# Patient Record
Sex: Male | Born: 1975 | Race: Black or African American | Hispanic: No | Smoking: Current every day smoker
Health system: Southern US, Community
[De-identification: ages and names within clinical notes are randomized; demographics above are authoritative.]

## PROBLEM LIST (undated history)

## (undated) DIAGNOSIS — K219 Gastro-esophageal reflux disease without esophagitis: Secondary | ICD-10-CM

## (undated) HISTORY — PX: NO PAST SURGERIES: SHX2092

---

## 2012-03-11 ENCOUNTER — Emergency Department: Payer: Self-pay | Admitting: *Deleted

## 2012-03-14 LAB — WOUND AEROBIC CULTURE

## 2013-06-28 ENCOUNTER — Emergency Department: Payer: Self-pay | Admitting: Emergency Medicine

## 2013-06-28 LAB — RAPID INFLUENZA A&B ANTIGENS (ARMC ONLY)

## 2014-11-11 ENCOUNTER — Encounter: Payer: Self-pay | Admitting: Emergency Medicine

## 2014-11-11 ENCOUNTER — Emergency Department
Admission: EM | Admit: 2014-11-11 | Discharge: 2014-11-11 | Disposition: A | Discharge status: Home / Self Care | Payer: Self-pay | Attending: Emergency Medicine | Admitting: Emergency Medicine

## 2014-11-11 DIAGNOSIS — L309 Dermatitis, unspecified: Secondary | ICD-10-CM | POA: Insufficient documentation

## 2014-11-11 DIAGNOSIS — Z720 Tobacco use: Secondary | ICD-10-CM | POA: Insufficient documentation

## 2014-11-11 MED ORDER — CLOTRIMAZOLE-BETAMETHASONE 1-0.05 % EX CREA: TOPICAL_CREAM | CUTANEOUS | Status: DC

## 2014-11-11 MED ORDER — HYDROXYZINE HCL 50 MG PO TABS: 50.0000 mg | ORAL_TABLET | Freq: Three times a day (TID) | ORAL | Status: DC | PRN

## 2014-11-11 NOTE — ED Notes (Signed)
Pt alert and oriented X4, active, cooperative, pt in NAD. RR even and unlabored, color WNL.  Pt informed to return if any life threatening symptoms occur.   

## 2014-11-11 NOTE — ED Notes (Signed)
Pt reports that he has had the rash since 2013, he thinks that it may be MRSA. No redness or drainage. It is a dark area with several white spots on it. It is on both ankles. Denies pain, has been putting hydrocortisone cream on it and it has been helping.

## 2014-11-11 NOTE — Discharge Instructions (Signed)
Take picture of rash with cell phone and follow up with [facility]ology [facility].

## 2014-11-11 NOTE — ED Provider Notes (Signed)
[facility] Emergency Department Provider Note  ____________________________________________  Time seen: Approximately 2:14 PM  I have reviewed the triage vital signs and the nursing notes.   HISTORY  Chief Complaint Rash    HPI Juan Holder is a 39 y.o. male patient complained of rash to the bilateral medial aspect of his bilateral lower legs for 3 years. Patient stated onset was while he was in prison. He is a rash comes and goes most times it resolves hydrocortisone over-the-counter cream. States there is itching associated with this. He is concerned he might have MRSA. Patient denies any fever or chills from his rash. He denies any drainage from the rash. Patient states the rash is not sees no and does not know any provocative incident producing a rash.   History reviewed. No pertinent past medical history.  There are no active problems to display for this patient.   History reviewed. No pertinent past surgical history.  Current Outpatient Rx  Name  Route  Sig  Dispense  Refill  . clotrimazole-betamethasone (LOTRISONE) cream      Apply to affected area 2 times daily   15 g   1   . hydrOXYzine (ATARAX/VISTARIL) 50 MG tablet   Oral   Take 1 tablet (50 mg total) by mouth 3 (three) times daily as needed.   30 tablet   0     Allergies Review of patient's allergies indicates no known allergies.  No family history on file.  Social History History  Substance Use Topics  . Smoking status: Current Every Day Smoker  . Smokeless tobacco: Not on file  . Alcohol Use: No    Review of Systems Constitutional: No fever/chills Eyes: No visual changes. ENT: No sore throat. Cardiovascular: Denies chest pain. Respiratory: Denies shortness of breath. Gastrointestinal: No abdominal pain.  No nausea, no vomiting.  No diarrhea.  No constipation. Genitourinary: Negative for dysuria. Musculoskeletal: Negative for back pain. Skin: Rash bilateral  legs. Neurological: Negative for headaches, focal weakness or numbness.  10-point ROS otherwise negative.  ____________________________________________   PHYSICAL EXAM:  VITAL SIGNS: ED Triage Vitals  Enc Vitals Group     BP 11/11/14 1229 136/71 mmHg     Pulse Rate 11/11/14 1229 69     Resp 11/11/14 1229 18     Temp 11/11/14 1229 98.3 F (36.8 C)     Temp Source 11/11/14 1229 Oral     SpO2 11/11/14 1229 100 %     Weight 11/11/14 1229 189 lb (85.73 kg)     Height 11/11/14 1229 6\' 2"  (1.88 m)     Head Cir --      Peak Flow --      Pain Score 11/11/14 1238 0     Pain Loc --      Pain Edu? --      Excl. in GC? --     Constitutional: Alert and oriented. Well appearing and in no acute distress. Eyes: Conjunctivae are normal. PERRL. EOMI. Head: Atraumatic. Nose: No congestion/rhinnorhea. Mouth/Throat: Mucous membranes are moist.  Oropharynx non-erythematous. Neck: No stridor.   Hematological/Lymphatic/Immunilogical: No cervical lymphadenopathy. Cardiovascular: Normal rate, regular rhythm. Grossly normal heart sounds.  Good peripheral circulation. Respiratory: Normal respiratory effort.  No retractions. Lungs CTAB. Gastrointestinal: Soft and nontender. No distention. No abdominal bruits. No CVA tenderness. Musculoskeletal: No lower extremity tenderness nor edema.  No joint effusions. Neurologic:  Normal speech and language. No gross focal neurologic deficits are appreciated. Speech is normal. No gait instability.  Skin:  Skin is warm, dry and intact. There is talking macular areas bilaterally measuring approximately 3 cm in circumference. There are small papular lesion visible. Area is nonerythematous and no signs of symptoms secondary infection Psychiatric: Mood and affect are normal. Speech and behavior are normal.  ____________________________________________   LABS (all labs ordered are listed, but only abnormal results are displayed)  Labs Reviewed - No data to  display ____________________________________________  EKG   ____________________________________________  RADIOLOGY   ____________________________________________   PROCEDURES  Procedure(s) performed: None  Critical Care performed: No  ____________________________________________   INITIAL IMPRESSION / ASSESSMENT AND PLAN / ED COURSE  Pertinent labs & imaging results that were available during my care of the patient were reviewed by me and considered in my medical decision making (see chart for details).  Dermatitis  ____________________________________________   FINAL CLINICAL IMPRESSION(S) / ED DIAGNOSES  Final diagnoses:  Dermatitis due to unknown cause      Joni Reiningonald K Smith, PA-C 11/11/14 1423

## 2014-11-11 NOTE — ED Notes (Signed)
Pt to ed with reports of rash to left ankle. Wants to be checked for MRSA>

## 2014-11-11 NOTE — ED Notes (Signed)
Pt has had "rash" to bilateral lower legs X3 years. Pt states that he is concerned it is MRSA. Denies pain, itching only. Darkened areas to inner bilateral lower legs, no discharge.

## 2014-12-24 ENCOUNTER — Emergency Department
Admission: EM | Admit: 2014-12-24 | Discharge: 2014-12-24 | Disposition: A | Discharge status: Home / Self Care | Payer: Self-pay | Attending: Emergency Medicine | Admitting: Emergency Medicine

## 2014-12-24 ENCOUNTER — Encounter: Payer: Self-pay | Admitting: Emergency Medicine

## 2014-12-24 DIAGNOSIS — R21 Rash and other nonspecific skin eruption: Secondary | ICD-10-CM | POA: Insufficient documentation

## 2014-12-24 DIAGNOSIS — Z720 Tobacco use: Secondary | ICD-10-CM | POA: Insufficient documentation

## 2014-12-24 MED ORDER — CLOTRIMAZOLE-BETAMETHASONE 1-0.05 % EX CREA: TOPICAL_CREAM | CUTANEOUS | Status: AC

## 2014-12-24 MED ORDER — SULFAMETHOXAZOLE-TRIMETHOPRIM 800-160 MG PO TABS: 1.0000 | ORAL_TABLET | Freq: Two times a day (BID) | ORAL | Status: DC

## 2014-12-24 NOTE — ED Notes (Signed)
Pt informed to return if any life threatening symptoms occur.  

## 2014-12-24 NOTE — Discharge Instructions (Signed)
YOU WILL NEED TO FOLLOW UP WITH A SKIN DOCTOR IF ANY CONTINUED PROBLEMS

## 2014-12-24 NOTE — ED Provider Notes (Signed)
[facility] Emergency Department Provider Note  ____________________________________________  Time seen:  11:13 AM  I have reviewed the triage vital signs and the nursing notes.   HISTORY  Chief Complaint Rash    HPI Juan Holder is a 39 y.o. male is here with complaint of rash on both his legs for approximately 8 months. He now has same rash in the groin area which is itching tremendously. He was here last month for the same at which time he was given a prescription for Lotrisone cream which seems to be helping and Atarax for itching. He denies any pain. He states the rash began shortly after his incarceration and has continued since.   History reviewed. No pertinent past medical history.  There are no active problems to display for this patient.   History reviewed. No pertinent past surgical history.  Current Outpatient Rx  Name  Route  Sig  Dispense  Refill  . clotrimazole-betamethasone (LOTRISONE) cream      Apply to affected area 2 times daily   30 g   1   . hydrOXYzine (ATARAX/VISTARIL) 50 MG tablet   Oral   Take 1 tablet (50 mg total) by mouth 3 (three) times daily as needed.   30 tablet   0   . sulfamethoxazole-trimethoprim (BACTRIM DS,SEPTRA DS) 800-160 MG per tablet   Oral   Take 1 tablet by mouth 2 (two) times daily.   20 tablet   0     Allergies Review of patient's allergies indicates no known allergies.  No family history on file.  Social History History  Substance Use Topics  . Smoking status: Current Every Day Smoker  . Smokeless tobacco: Not on file  . Alcohol Use: No    Review of Systems Constitutional: No fever/chills Eyes: No visual changes. ENT: No sore throat. Cardiovascular: Denies chest pain. Respiratory: Denies shortness of breath. Gastrointestinal: No abdominal pain.  No nausea, no vomiting.  Genitourinary: Negative for dysuria. Musculoskeletal: Negative for back pain. Skin: Positive for  rash. Neurological: Negative for headaches 10-point ROS otherwise negative.  ____________________________________________   PHYSICAL EXAM:  VITAL SIGNS: ED Triage Vitals  Enc Vitals Group     BP --      Pulse --      Resp --      Temp --      Temp src --      SpO2 --      Weight --      Height --      Head Cir --      Peak Flow --      Pain Score --      Pain Loc --      Pain Edu? --      Excl. in GC? --     Constitutional: Alert and oriented. Well appearing and in no acute distress. Eyes: Conjunctivae are normal. PERRL. EOMI. Head: Atraumatic. Nose: No congestion/rhinnorhea. Neck: No stridor.   Cardiovascular: Normal rate, regular rhythm. Grossly normal heart sounds.  Good peripheral circulation. Respiratory: Normal respiratory effort.  No retractions. Lungs CTAB. Gastrointestinal: Soft and nontender. No distention. No abdominal bruits. No CVA tenderness. Musculoskeletal: No lower extremity tenderness nor edema.  No joint effusions. Neurologic:  Normal speech and language. No gross focal neurologic deficits are appreciated. Speech is normal. No gait instability. Skin:  Skin is warm, dry and intact. There is superficial papular rash with open areas on the lower extremities bilaterally.. There is no erythema or drainage.  There are several small papular lesions in the pubic area without erythema. The majority of these are at the base of the hair follicle. Psychiatric: Mood and affect are normal. Speech and behavior are normal.  ____________________________________________   LABS (all labs ordered are listed, but only abnormal results are displayed)  Labs Reviewed - No data to display _ PROCEDURES  Procedure(s) performed: None  Critical Care performed: No  ____________________________________________   INITIAL IMPRESSION / ASSESSMENT AND PLAN / ED COURSE  Pertinent labs & imaging results that were available during my care of the patient were reviewed by me and  considered in my medical decision making (see chart for details).  Patient was given the name of a dermatologist to follow-up with if any continued problems. He is given a prescription for Lotrisone cream and Septra DS. ____________________________________________   FINAL CLINICAL IMPRESSION(S) / ED DIAGNOSES  Final diagnoses:  Rash and nonspecific skin eruption      Tommi Rumps, PA-C 12/24/14 1604  Jene Every, MD 12/26/14 1530

## 2014-12-24 NOTE — ED Notes (Signed)
Developed a rash to both lower legs about 8 mos ago.. Now has a rash to groin area  Positive itching

## 2015-12-06 ENCOUNTER — Emergency Department
Admission: EM | Admit: 2015-12-06 | Discharge: 2015-12-06 | Disposition: A | Discharge status: Home / Self Care | Payer: Self-pay | Attending: Emergency Medicine | Admitting: Emergency Medicine

## 2015-12-06 DIAGNOSIS — F17210 Nicotine dependence, cigarettes, uncomplicated: Secondary | ICD-10-CM | POA: Insufficient documentation

## 2015-12-06 DIAGNOSIS — Z79899 Other long term (current) drug therapy: Secondary | ICD-10-CM | POA: Insufficient documentation

## 2015-12-06 DIAGNOSIS — R197 Diarrhea, unspecified: Secondary | ICD-10-CM | POA: Insufficient documentation

## 2015-12-06 LAB — CBC
HEMATOCRIT: 39.8 % — AB (ref 40.0–52.0)
Hemoglobin: 13.5 g/dL (ref 13.0–18.0)
MCH: 30.5 pg (ref 26.0–34.0)
MCHC: 33.8 g/dL (ref 32.0–36.0)
MCV: 90.2 fL (ref 80.0–100.0)
PLATELETS: 191 10*3/uL (ref 150–440)
RBC: 4.42 MIL/uL (ref 4.40–5.90)
RDW: 13.3 % (ref 11.5–14.5)
WBC: 6.9 10*3/uL (ref 3.8–10.6)

## 2015-12-06 LAB — COMPREHENSIVE METABOLIC PANEL
ALT: 14 U/L — ABNORMAL LOW (ref 17–63)
AST: 24 U/L (ref 15–41)
Albumin: 4.2 g/dL (ref 3.5–5.0)
Alkaline Phosphatase: 68 U/L (ref 38–126)
Anion gap: 7 (ref 5–15)
BUN: 13 mg/dL (ref 6–20)
CHLORIDE: 107 mmol/L (ref 101–111)
CO2: 24 mmol/L (ref 22–32)
Calcium: 9.6 mg/dL (ref 8.9–10.3)
Creatinine, Ser: 1.24 mg/dL (ref 0.61–1.24)
Glucose, Bld: 101 mg/dL — ABNORMAL HIGH (ref 65–99)
POTASSIUM: 3.4 mmol/L — AB (ref 3.5–5.1)
Sodium: 138 mmol/L (ref 135–145)
Total Bilirubin: 0.5 mg/dL (ref 0.3–1.2)
Total Protein: 7.3 g/dL (ref 6.5–8.1)

## 2015-12-06 LAB — LIPASE, BLOOD: LIPASE: 18 U/L (ref 11–51)

## 2015-12-06 MED ORDER — LOPERAMIDE HCL 2 MG PO TABS: 2.0000 mg | ORAL_TABLET | Freq: Four times a day (QID) | ORAL | Status: DC | PRN

## 2015-12-06 NOTE — Discharge Instructions (Signed)

## 2015-12-06 NOTE — ED Provider Notes (Signed)
[facility] Emergency Department Provider Note  Time seen: 5:33 PM  I have reviewed the triage vital signs and the nursing notes.   HISTORY  Chief Complaint Diarrhea    HPI Juan Holder is a 40 y.o. male with no past medical history who presents to the emergency department with diarrhea. According to the patient he developed diarrhea yesterday, states he has had 3 episodes today. Denies any abdominal pain, nausea, vomiting, black or bloody stool. Denies any fever. States he did not think he could go to work due to the diarrhea.     No past medical history on file.  There are no active problems to display for this patient.   History reviewed. No pertinent past surgical history.  Current Outpatient Rx  Name  Route  Sig  Dispense  Refill  . omeprazole (PRILOSEC) 20 MG capsule   Oral   Take 20 mg by mouth daily.         . clotrimazole-betamethasone (LOTRISONE) cream      Apply to affected area 2 times daily   30 g   1   . hydrOXYzine (ATARAX/VISTARIL) 50 MG tablet   Oral   Take 1 tablet (50 mg total) by mouth 3 (three) times daily as needed.   30 tablet   0   . sulfamethoxazole-trimethoprim (BACTRIM DS,SEPTRA DS) 800-160 MG per tablet   Oral   Take 1 tablet by mouth 2 (two) times daily.   20 tablet   0     Allergies Review of patient's allergies indicates no known allergies.  No family history on file.  Social History Social History  Substance Use Topics  . Smoking status: Current Every Day Smoker    Types: Cigarettes  . Smokeless tobacco: None  . Alcohol Use: No    Review of Systems Constitutional: Negative for fever. Cardiovascular: Negative for chest pain. Respiratory: Negative for shortness of breath. Gastrointestinal: Negative for abdominal pain. Negative for nausea or vomiting. Positive for diarrhea. Genitourinary: Negative for dysuria Neurological: Negative for headache 10-point ROS otherwise  negative.  ____________________________________________   PHYSICAL EXAM:  VITAL SIGNS: ED Triage Vitals  Enc Vitals Group     BP 12/06/15 1547 112/74 mmHg     Pulse Rate 12/06/15 1547 89     Resp 12/06/15 1547 17     Temp 12/06/15 1547 98.5 F (36.9 C)     Temp Source 12/06/15 1547 Oral     SpO2 12/06/15 1547 98 %     Weight 12/06/15 1547 190 lb (86.183 kg)     Height 12/06/15 1547 6\' 2"  (1.88 m)     Head Cir --      Peak Flow --      Pain Score --      Pain Loc --      Pain Edu? --      Excl. in GC? --     Constitutional: Alert and oriented. Well appearing and in no distress. Eyes: Normal exam ENT   Head: Normocephalic and atraumatic.   Mouth/Throat: Mucous membranes are moist. Cardiovascular: Normal rate, regular rhythm. No murmur Respiratory: Normal respiratory effort without tachypnea nor retractions. Breath sounds are clear  Gastrointestinal: Soft and nontender. No distention.   Musculoskeletal: Nontender with normal range of motion in all extremities.  Neurologic:  Normal speech and language. No gross focal neurologic deficits Skin:  Skin is warm, dry and intact.  Psychiatric: Mood and affect are normal.  ____________________________________________   INITIAL IMPRESSION /  ASSESSMENT AND PLAN / ED COURSE  Pertinent labs & imaging results that were available during my care of the patient were reviewed by me and considered in my medical decision making (see chart for details).  Patient appears well, no distress. No abdominal tenderness. Labs are within normal limits. We'll discharge with loperamide and PCP follow-up. I discussed return precautions with the patient.  ____________________________________________   FINAL CLINICAL IMPRESSION(S) / ED DIAGNOSES  Diarrhea   Minna Antis, MD 12/06/15 1735

## 2015-12-06 NOTE — ED Notes (Signed)
Pt c/o diarrhea since yesterday.. Denies abd pain or vomiting..Marland Kitchen

## 2016-03-27 ENCOUNTER — Emergency Department: Payer: Self-pay

## 2016-03-27 ENCOUNTER — Encounter: Payer: Self-pay | Admitting: Emergency Medicine

## 2016-03-27 ENCOUNTER — Emergency Department
Admission: EM | Admit: 2016-03-27 | Discharge: 2016-03-27 | Disposition: A | Discharge status: Home / Self Care | Payer: Self-pay | Attending: Emergency Medicine | Admitting: Emergency Medicine

## 2016-03-27 DIAGNOSIS — F17210 Nicotine dependence, cigarettes, uncomplicated: Secondary | ICD-10-CM | POA: Insufficient documentation

## 2016-03-27 DIAGNOSIS — K4090 Unilateral inguinal hernia, without obstruction or gangrene, not specified as recurrent: Secondary | ICD-10-CM | POA: Insufficient documentation

## 2016-03-27 DIAGNOSIS — R1031 Right lower quadrant pain: Secondary | ICD-10-CM

## 2016-03-27 DIAGNOSIS — R52 Pain, unspecified: Secondary | ICD-10-CM

## 2016-03-27 DIAGNOSIS — N50811 Right testicular pain: Secondary | ICD-10-CM

## 2016-03-27 LAB — URINALYSIS COMPLETE WITH MICROSCOPIC (ARMC ONLY)
Bacteria, UA: NONE SEEN
Bilirubin Urine: NEGATIVE
GLUCOSE, UA: NEGATIVE mg/dL
Hgb urine dipstick: NEGATIVE
KETONES UR: NEGATIVE mg/dL
Leukocytes, UA: NEGATIVE
Nitrite: NEGATIVE
PROTEIN: 30 mg/dL — AB
Specific Gravity, Urine: 1.023 (ref 1.005–1.030)
pH: 7 (ref 5.0–8.0)

## 2016-03-27 NOTE — ED Notes (Signed)
Patient transported to Ultrasound 

## 2016-03-27 NOTE — ED Notes (Signed)
Pt complains of pain to right testicle intermittently for the past several years. Pt reports most recent episode happened a couple of says ago. Pt denies fevers, dysuria or other sx's other than intermittent pain. Pt wife reports pt has had a small lump come up before but it went back down. Pt reports lump was painful when it came up.

## 2016-03-27 NOTE — ED Notes (Signed)
Pt to US at this time.

## 2016-03-27 NOTE — ED Provider Notes (Signed)
[facility] Emergency Department Provider Note   First MD Initiated Contact with Patient 03/27/16 1022     (approximate)  I have reviewed the triage vital signs and the nursing notes.   HISTORY  Chief Complaint Testicle Pain   HPI Juan Holder is a 40 y.o. male presents with intermittent right testicular/groin pain since 2012. Patient states most recent pain episode 2 days ago and was described as intense at that time. Patient denies any pain at present current pain score "0" out of 10. Patient denies any nausea no vomiting or diarrhea. Patient denies any constipation or fever. Patient denies any urinary symptoms no dysuria urinary frequency or urgency.    There are no active problems to display for this patient.   Past surgical history None  Prior to Admission medications   Medication Sig Start Date End Date Taking? Authorizing Provider  hydrOXYzine (ATARAX/VISTARIL) 50 MG tablet Take 1 tablet (50 mg total) by mouth 3 (three) times daily as needed. 11/11/14   Joni Reining, PA-C  loperamide (IMODIUM A-D) 2 MG tablet Take 1 tablet (2 mg total) by mouth 4 (four) times daily as needed for diarrhea or loose stools. 12/06/15   Minna Antis, MD  omeprazole (PRILOSEC) 20 MG capsule Take 20 mg by mouth daily.    Historical Provider, MD  sulfamethoxazole-trimethoprim (BACTRIM DS,SEPTRA DS) 800-160 MG per tablet Take 1 tablet by mouth 2 (two) times daily. 12/24/14   Tommi Rumps, PA-C    Allergies Review of patient's allergies indicates no known allergies.  No family history on file.  Social History Social History  Substance Use Topics  . Smoking status: Current Every Day Smoker    Types: Cigarettes  . Smokeless tobacco: Never Used  . Alcohol use Yes     Comment: social    Review of Systems Constitutional: No fever/chills Eyes: No visual changes. ENT: No sore throat. Cardiovascular: Denies chest pain. Respiratory: Denies shortness of  breath. Gastrointestinal: No abdominal pain.  No nausea, no vomiting.  No diarrhea.  No constipation. Genitourinary: Negative for dysuria. Positive for right testicular pain Musculoskeletal: Negative for back pain. Skin: Negative for rash. Neurological: Negative for headaches, focal weakness or numbness.  10-point ROS otherwise negative.  ____________________________________________   PHYSICAL EXAM:  VITAL SIGNS: ED Triage Vitals  Enc Vitals Group     BP 03/27/16 0826 135/77     Pulse Rate 03/27/16 0826 74     Resp 03/27/16 0826 18     Temp 03/27/16 0826 98.7 F (37.1 C)     Temp Source 03/27/16 0826 Oral     SpO2 03/27/16 0826 98 %     Weight 03/27/16 0827 214 lb (97.1 kg)     Height 03/27/16 0827 6\' 2"  (1.88 m)     Head Circumference --      Peak Flow --      Pain Score 03/27/16 0831 7     Pain Loc --      Pain Edu? --      Excl. in GC? --     Constitutional: Alert and oriented. Well appearing and in no acute distress. Eyes: Conjunctivae are normal. PERRL. EOMI. Head: Atraumatic.Marland Kitchen Mouth/Throat: Mucous membranes are moist.  Oropharynx non-erythematous. Neck: No stridor.  No meningeal signs.  Cardiovascular: Normal rate, regular rhythm. Good peripheral circulation. Grossly normal heart sounds. Respiratory: Normal respiratory effort.  No retractions. Lungs CTAB. Gastrointestinal: Soft and nontender. No distention.  Musculoskeletal: No lower extremity tenderness nor edema.  No gross deformities of extremities. Neurologic:  Normal speech and language. No gross focal neurologic deficits are appreciated.  Skin:  Skin is warm, dry and intact. No rash noted. Psychiatric: Mood and affect are normal. Speech and behavior are normal.  ____________________________________________   LABS (all labs ordered are listed, but only abnormal results are displayed)  Labs Reviewed  URINALYSIS COMPLETEWITH MICROSCOPIC ([facility] ONLY)    RADIOLOGY I, Carleton N Aarohi Redditt, personally viewed  and evaluated these images (plain radiographs) as part of my medical decision making, as well as reviewing the written report by the radiologist.  [place] Extrem Low Right Ltd  Result Date: 03/27/2016 CLINICAL DATA:  Right groin pain for 5 years. EXAM: ULTRASOUND RIGHT LOWER EXTREMITY LIMITED TECHNIQUE: Ultrasound examination of the lower extremity soft tissues was performed in the area of clinical concern. COMPARISON:  None FINDINGS: Normal appearing lymph node at the area of interest in the right groin. This lymph node measures 1.4 x 0.6 x 0.7 cm. No suspicious solid or cystic lesions at the area of concern. IMPRESSION: Normal lymph node at the area of concern in the right groin. Electronically Signed   By: Richarda OverlieAdam  Henn M.D.   On: 03/27/2016 10:08      Procedures     INITIAL IMPRESSION / ASSESSMENT AND PLAN / ED COURSE  Pertinent labs & imaging results that were available during my care of the patient were reviewed by me and considered in my medical decision making (see chart for details).     Clinical Course    ____________________________________________  FINAL CLINICAL IMPRESSION(S) / ED DIAGNOSES  Final diagnoses:  Pain  Pain in right testicle  Right inguinal hernia     MEDICATIONS GIVEN DURING THIS VISIT:  Medications - No data to display   NEW OUTPATIENT MEDICATIONS STARTED DURING THIS VISIT:  New Prescriptions   No medications on file    Modified Medications   No medications on file    Discontinued Medications   No medications on file     Note:  This document was prepared using Dragon voice recognition software and may include unintentional dictation errors.    Darci Currentandolph N Janie Strothman, MD 03/28/16 [phone]

## 2016-03-27 NOTE — ED Triage Notes (Signed)
Pain in right testicle after voiding.  It started in 2012.  Recurrent.  This episode began 2 days ago.  Patient states he thinks he has a hernia.  Denies any dysuria.

## 2016-03-29 ENCOUNTER — Ambulatory Visit (INDEPENDENT_AMBULATORY_CARE_PROVIDER_SITE_OTHER): Payer: Self-pay | Admitting: Surgery

## 2016-03-29 ENCOUNTER — Encounter: Payer: Self-pay | Admitting: Surgery

## 2016-03-29 VITALS — BP 158/81 | HR 83 | Temp 99.2°F | Ht 74.0 in | Wt 213.2 lb

## 2016-03-29 DIAGNOSIS — R1030 Lower abdominal pain, unspecified: Secondary | ICD-10-CM

## 2016-03-29 NOTE — Progress Notes (Signed)
  Surgical Consultation  03/29/2016  Juan HalstedMelvin D Holder is an 40 y.o. male.   Chief Complaint  Patient presents with  . New Patient (Initial Visit)    Right Inguinal Hernia     HPI: This is a 40 year old male with a history of right inguinal pain that is started over 5 years ago. He reports and he has intermittent right inguinal pain radiated to the right testicle. Over the last few weeks it has increased in intensity and now is moderate to severe. It is sharp and is worsening when he voids. He recently went to the emergency room where an ultrasound was performed without any definitive evidence of any particular pathology. I have personally reviewed the ultrasound. There is no evidence of testicular torsion. He denies any other previous abdominal surgeries. He has good cardiovascular performance and is able to do more than 4 Mets without any shortness of breath or chest pain  History reviewed. No pertinent past medical history.  Past Surgical History:  Procedure Laterality Date  . NO PAST SURGERIES      Family History  Problem Relation Age of Onset  . Hypertension Father   . Lumbar disc disease Father     Social History:  reports that he has been smoking Cigarettes.  He has been smoking about 0.50 packs per day. He has never used smokeless tobacco. He reports that he drinks alcohol. He reports that he does not use drugs.  Allergies: No Known Allergies  Medications reviewed.     ROS Full ROS performed and is  otherwise negative other than what is  stated in the history of present illness  BP (!) 158/81   Pulse 83   Temp 99.2 F (37.3 C) (Oral)   Ht 6\' 2"  (1.88 m)   Wt 96.7 kg (213 lb 3.2 oz)   BMI 27.37 kg/m   Physical Exam  Constitutional: He is oriented to person, place, and time and well-developed, well-nourished, and in no distress. No distress.  Eyes: Conjunctivae are normal. No scleral icterus.  Neck: Neck supple. No JVD present. No tracheal deviation present.   Cardiovascular: Normal rate and intact distal pulses.   Pulmonary/Chest: Effort normal. No respiratory distress.  Abdominal: Soft. He exhibits no distension and no mass. There is no tenderness. There is no rebound and no guarding.  Reducible Umbilical hernia, no definitive Inguinal hernia palpated  Genitourinary: Penis normal. No discharge found.  Genitourinary Comments: Testicles w/o lesions or masses. Non tender to palpation   Musculoskeletal: Normal range of motion. He exhibits no edema.  Neurological: He is alert and oriented to person, place, and time. GCS score is 15.  Skin: Skin is warm and dry.  Psychiatric: Mood, memory, affect and judgment normal.  Nursing note and vitals reviewed.    No results found for this or any previous visit (from the past 48 hour(s)). No results found.  Assessment/Plan: 1. Lower abdominal pain Right inguinal pain w/o definitive evidence of hernia on PE . Differential will  definitely include at sports hernia as well as GU pathology We will obtain CT A/P as well as GU consultation. No need for surgical intervention at this time. Counseling provided. - CT Abdomen Pelvis W Contrast; Future   Sterling Bigiego Amantha Sklar, MD FACS General Surgeon

## 2016-03-29 NOTE — Patient Instructions (Addendum)
We have scheduled you for a CT Scan of your Abdomen and Pelvis. This has been scheduled for [employer] on 04/04/16 at 1100am. Arrival at [place], [place], [place],  [address]  You will need to pick up a prep kit at least 24 hours in advance of your Scan: You may pick this up at the [facility] [place] [facility], or [employer].  Bring a list of medications with you to your appointment.  If you have an allergy to IVP dye or CT contrast and this has not been addressed, please call our office [phone] and ask to speak with a nurse at least 48 hours in advance of your CT Scan.  Nothing to eat or drink 4 hours prior to your CT Scan.   If you need to reschedule your Scan, you may do so by calling [phone].  We will have you follow-up in 2 weeks as scheduled below.  We will also place a referral to [facility] for your testicular pain. We will call you as soon as the appointment has been made.

## 2016-04-04 ENCOUNTER — Ambulatory Visit
Admission: RE | Admit: 2016-04-04 | Discharge: 2016-04-04 | Disposition: A | Discharge status: Home / Self Care | Payer: Self-pay | Source: Ambulatory Visit | Attending: Surgery | Admitting: Surgery

## 2016-04-04 DIAGNOSIS — K429 Umbilical hernia without obstruction or gangrene: Secondary | ICD-10-CM | POA: Insufficient documentation

## 2016-04-04 DIAGNOSIS — R1030 Lower abdominal pain, unspecified: Secondary | ICD-10-CM | POA: Insufficient documentation

## 2016-04-04 MED ORDER — IOPAMIDOL (ISOVUE-300) INJECTION 61%: 100.0000 mL | Freq: Once | INTRAVENOUS | Status: DC | PRN

## 2016-04-04 MED ORDER — IOPAMIDOL (ISOVUE-370) INJECTION 76%
100.0000 mL | Freq: Once | INTRAVENOUS | Status: AC | PRN
  Administered 2016-04-04: 100 mL via INTRAVENOUS

## 2016-04-06 ENCOUNTER — Telehealth: Payer: Self-pay

## 2016-04-06 NOTE — Telephone Encounter (Signed)
A referral has been entered in EPIC to refer patient to [facility] for testicular pain.   The department will call him to make an appointment.  I will follow up on referral to make sure appointment is made. I will document once appointment is made.

## 2016-04-06 NOTE — Telephone Encounter (Signed)
Please place Urology referral to [facility] for testicular pain for this patient.

## 2016-04-07 ENCOUNTER — Telehealth: Payer: Self-pay

## 2016-04-07 NOTE — Telephone Encounter (Addendum)
CT was received and reviewed with Dr. Everlene FarrierPabon. Patient is in need of GI appointment with Dr. Servando SnareWohl for possible Crohn's disease seen on CT Scan.  Patient scheduled with Dr. Servando SnareWohl on 04/11/16 at 1430 in [facility].  Call made to patient at this time to go over results of CT scan and give patient appointment information. No answer. Unable to leave a voicemail as this has not been set up yet.

## 2016-04-07 NOTE — Telephone Encounter (Signed)
I have contacted [facility] to get an update on status of referral. Appointment has not been made at this time. The referral cord is out until next week. The referral will be worked up at that time.

## 2016-04-10 NOTE — Telephone Encounter (Signed)
Appointment has been made with Dr Charyl DancerBrandon--Urology on 05/03/16 @ 11:00am.

## 2016-04-11 ENCOUNTER — Ambulatory Visit (INDEPENDENT_AMBULATORY_CARE_PROVIDER_SITE_OTHER): Payer: Self-pay | Admitting: Gastroenterology

## 2016-04-11 ENCOUNTER — Encounter: Payer: Self-pay | Admitting: Gastroenterology

## 2016-04-11 ENCOUNTER — Other Ambulatory Visit: Payer: Self-pay

## 2016-04-11 VITALS — BP 147/67 | HR 66 | Temp 98.9°F | Ht 74.0 in | Wt 215.6 lb

## 2016-04-11 DIAGNOSIS — R109 Unspecified abdominal pain: Secondary | ICD-10-CM

## 2016-04-11 DIAGNOSIS — R933 Abnormal findings on diagnostic imaging of other parts of digestive tract: Secondary | ICD-10-CM

## 2016-04-11 MED ORDER — PEG 3350-KCL-NABCB-NACL-NASULF 236 G PO SOLR
ORAL | 0 refills | Status: AC
Start: 2016-04-11 — End: ?

## 2016-04-11 NOTE — Progress Notes (Signed)
Gastroenterology Consultation  Referring Provider:     No ref. provider found Primary Care Physician:  No PCP Per Patient Primary Gastroenterologist:  Dr. Servando Snare     Reason for Consultation:     Abdominal bloating and abnormal CT scan        HPI:   Juan Holder is a 40 y.o. y/o male referred for consultation & management of Abdominal bloating and abnormal CT scan by Dr. Bonnetta Barry PCP Per Patient.  This patient comes today with a history of peptic ulcer disease and reflux who is presently on a PPI. The patient was seen by surgery for abdominal bloating and right-sided abdominal pain that went down to his groin and right leg. The patient denies any black stools or bloody stools although he states he has seen some bright red blood on the toilet paper in the past. He also denies any diarrhea. The patient was sent for CT scan that showed him to have thickening of the terminal ileum with possible differential diagnosis including infection inflammation and Crohn's disease. The patient denies any family history of inflammatory bowel disease. He also denies any joint pains except one joint in his right leg. There is no report of any unexplained weight loss. I'm now being asked to see the patient for his abnormal CT scan.  History reviewed. No pertinent past medical history.  Past Surgical History:  Procedure Laterality Date  . NO PAST SURGERIES      Prior to Admission medications   Medication Sig Start Date End Date Taking? Authorizing Provider  hydrOXYzine (ATARAX/VISTARIL) 50 MG tablet Take 1 tablet (50 mg total) by mouth 3 (three) times daily as needed. 11/11/14  Yes Joni Reining, PA-C  omeprazole (PRILOSEC) 20 MG capsule Take 20 mg by mouth daily.   Yes Historical Provider, MD  triamcinolone (KENALOG) 0.025 % cream Apply 1 application topically daily.   Yes Historical Provider, MD  polyethylene glycol (GOLYTELY) 236 g solution Drink one 8 oz glass every 20 mins until stools are clear. 04/11/16    Midge Minium, MD    Family History  Problem Relation Age of Onset  . Hypertension Father   . Lumbar disc disease Father      Social History  Substance Use Topics  . Smoking status: Current Every Day Smoker    Packs/day: 0.50    Types: Cigarettes  . Smokeless tobacco: Never Used  . Alcohol use Yes     Comment: 1 Beer Weekly    Allergies as of 04/11/2016  . (No Known Allergies)    Review of Systems:    All systems reviewed and negative except where noted in HPI.   Physical Exam:  BP (!) 147/67   Pulse 66   Temp 98.9 F (37.2 C) (Oral)   Ht 6\' 2"  (1.88 m)   Wt 215 lb 9.6 oz (97.8 kg)   BMI 27.68 kg/m  No LMP for male patient. Psych:  Alert and cooperative. Normal mood and affect. General:   Alert,  Well-developed, well-nourished, pleasant and cooperative in NAD Head:  Normocephalic and atraumatic. Eyes:  Sclera clear, no icterus.   Conjunctiva pink. Ears:  Normal auditory acuity. Nose:  No deformity, discharge, or lesions. Mouth:  No deformity or lesions,oropharynx pink & moist. Neck:  Supple; no masses or thyromegaly. Lungs:  Respirations even and unlabored.  Clear throughout to auscultation.   No wheezes, crackles, or rhonchi. No acute distress. Heart:  Regular rate and rhythm; no murmurs, clicks, rubs, or  gallops. Abdomen:  Normal bowel sounds.  No bruits.  Soft, non-tender and non-distended without masses, hepatosplenomegaly or hernias noted.  No guarding or rebound tenderness.  Negative Carnett sign.   Rectal:  Deferred.  Msk:  Symmetrical without gross deformities.  Good, equal movement & strength bilaterally. Pulses:  Normal pulses noted. Extremities:  No clubbing or edema.  No cyanosis. Neurologic:  Alert and oriented x3;  grossly normal neurologically. Skin:  Intact without significant lesions or rashes.  No jaundice. Lymph Nodes:  No significant cervical adenopathy. Psych:  Alert and cooperative. Normal mood and affect.  Imaging Studies: Koreas  Scrotum  Result Date: 03/27/2016 CLINICAL DATA:  Right testicular and groin pain, intermittent but worsening over the last 2 days. EXAM: SCROTAL ULTRASOUND DOPPLER ULTRASOUND OF THE TESTICLES TECHNIQUE: Complete ultrasound examination of the testicles, epididymis, and other scrotal structures was performed. Color and spectral Doppler ultrasound were also utilized to evaluate blood flow to the testicles. COMPARISON:  None. FINDINGS: Right testicle Measurements: 4.4 by 2.5 by 2.9 cm. No mass or microlithiasis visualized. Faint heteroechogenicity in the testicle. Left testicle Measurements: 4.4 by 2.3 by 2.6 cm. No mass or microlithiasis visualized. Faint heteroechogenicity in the testicle Right epididymis:  Normal in size and appearance. Left epididymis:  Normal in size and appearance. Hydrocele:  None visualized. Varicocele:  None visualized. Pulsed Doppler interrogation of both testes demonstrates normal low resistance arterial and venous waveforms bilaterally. IMPRESSION: 1. The only notable finding is faint heteroechogenicity in both testicles, without mass identified. In middle-aged and older patients, such heterogeneous echotexture is typically ascribed to incidental seminiferous tubular atrophy. Normal Doppler assessment of both testicles. Electronically Signed   By: Gaylyn RongWalter  Liebkemann M.D.   On: 03/27/2016 12:05   Ct Abdomen Pelvis W Contrast  Result Date: 04/04/2016 CLINICAL DATA:  Abdominal pain. Recently evaluated in the emergency room for right groin pain. EXAM: CT ABDOMEN AND PELVIS WITH CONTRAST TECHNIQUE: Multidetector CT imaging of the abdomen and pelvis was performed using the standard protocol following bolus administration of intravenous contrast. CONTRAST:  100 mL Isovue 370 COMPARISON:  Scrotal ultrasound 03/27/2016 and right lower extremity ultrasound 03/27/2016 FINDINGS: Lower chest: The visualized lung bases are clear. The heart size is normal. Hepatobiliary: No focal liver abnormality  is seen. No gallstones, gallbladder wall thickening, or biliary dilatation. Pancreas: Unremarkable. No pancreatic ductal dilatation or surrounding inflammatory changes. Spleen: Normal in size without focal abnormality. Adrenals/Urinary Tract: Adrenal glands are unremarkable. Kidneys are normal, without renal calculi, focal lesion, or hydronephrosis. Bladder is unremarkable. Stomach/Bowel: Stomach is within normal limits. Appendix appears normal. There is wall thickening of the terminal ileum. A single layer wall thickness measures up to 8 mm on the coronal image number 34. Proximal to the terminal ileum, the remainder of the small bowel is normal in wall thickness. There is no evidence of bowel obstruction. There is no stranding or fluid in the mesentery surrounding the terminal ileum and there is no free pelvic fluid. There are reactive size lymph nodes in the ileocolic mesentery. For example, there is a right ileocolic lymph node measuring short 7 mm short axis and 13 mm in length. There are additional smaller lymph nodes in this region. The colon appears within normal limits. Vascular/Lymphatic: No significant vascular findings are present. Reactive size right lower quadrant ileocolic lymph nodes is described above. Reproductive: Prostate is unremarkable. Other: The inguinal regions are unremarkable. Negative for inguinal hernia. There is an umbilical hernia containing fat only. The hernia sac measures 2.1 x 2.4  x 1.8 cm and the mouth of the hernia is 1.5 cm in with. Musculoskeletal: No acute or significant osseous findings. Sacroiliac joints appear within normal limits. IMPRESSION: Wall thickening of the terminal ileum and reactive size lymph nodes in the ileocolic mesenteric. Findings raise the possibility of ileitis, which can be infectious, or secondary to inflammatory bowel disease, especially Crohn's disease. There is no inflammatory stranding or fluid surrounding the terminal ileum, and there is no bowel  dilatation. The inguinal regions are unremarkable. Negative for urinary tract obstruction. Fat-containing umbilical hernia. Electronically Signed   By: Britta Mccreedy M.D.   On: 04/04/2016 13:24   [place] Extrem Low Right Ltd  Result Date: 03/27/2016 CLINICAL DATA:  Right groin pain for 5 years. EXAM: ULTRASOUND RIGHT LOWER EXTREMITY LIMITED TECHNIQUE: Ultrasound examination of the lower extremity soft tissues was performed in the area of clinical concern. COMPARISON:  None FINDINGS: Normal appearing lymph node at the area of interest in the right groin. This lymph node measures 1.4 x 0.6 x 0.7 cm. No suspicious solid or cystic lesions at the area of concern. IMPRESSION: Normal lymph node at the area of concern in the right groin. Electronically Signed   By: Richarda Overlie M.D.   On: 03/27/2016 10:08   [place] Art/ven Flow Abd Pelv Doppler  Result Date: 03/27/2016 CLINICAL DATA:  Right testicular and groin pain, intermittent but worsening over the last 2 days. EXAM: SCROTAL ULTRASOUND DOPPLER ULTRASOUND OF THE TESTICLES TECHNIQUE: Complete ultrasound examination of the testicles, epididymis, and other scrotal structures was performed. Color and spectral Doppler ultrasound were also utilized to evaluate blood flow to the testicles. COMPARISON:  None. FINDINGS: Right testicle Measurements: 4.4 by 2.5 by 2.9 cm. No mass or microlithiasis visualized. Faint heteroechogenicity in the testicle. Left testicle Measurements: 4.4 by 2.3 by 2.6 cm. No mass or microlithiasis visualized. Faint heteroechogenicity in the testicle Right epididymis:  Normal in size and appearance. Left epididymis:  Normal in size and appearance. Hydrocele:  None visualized. Varicocele:  None visualized. Pulsed Doppler interrogation of both testes demonstrates normal low resistance arterial and venous waveforms bilaterally. IMPRESSION: 1. The only notable finding is faint heteroechogenicity in both testicles, without mass identified. In middle-aged and older  patients, such heterogeneous echotexture is typically ascribed to incidental seminiferous tubular atrophy. Normal Doppler assessment of both testicles. Electronically Signed   By: Gaylyn Rong M.D.   On: 03/27/2016 12:05    Assessment and Plan:   Juan Holder is a 40 y.o. y/o male who comes in today after having a CT scan which was suggestive of possible Crohn's disease. The patient does have some bloating and has had a small amount of rectal bleeding that sounds to be most consistent with hemorrhoidal bleeding. The patient will be set up for colonoscopy to rule out terminal ileitis/Crohn's disease as the cause of his bloating and abdominal pain. The patient has been explained the plan and agrees with it.   Note: This dictation was prepared with Dragon dictation along with smaller phrase technology. Any transcriptional errors that result from this process are unintentional.

## 2016-04-11 NOTE — Patient Instructions (Signed)
You are scheduled for a colonoscopy at [facility] on Monday, November 6th. You have been given instructions and a prescription for a bowel prep solution. If you have any questions in regards to this procedure, please contact our office.

## 2016-04-13 ENCOUNTER — Ambulatory Visit: Payer: Self-pay | Admitting: Surgery

## 2016-04-24 ENCOUNTER — Encounter: Payer: Self-pay | Admitting: *Deleted

## 2016-04-26 NOTE — Discharge Instructions (Signed)

## 2016-05-01 ENCOUNTER — Encounter
Admission: RE | Disposition: A | Discharge status: Home / Self Care | Payer: Self-pay | Source: Ambulatory Visit | Attending: Gastroenterology

## 2016-05-01 ENCOUNTER — Ambulatory Visit
Admission: RE | Admit: 2016-05-01 | Discharge: 2016-05-01 | Disposition: A | Discharge status: Home / Self Care | Payer: Self-pay | Source: Ambulatory Visit | Attending: Gastroenterology | Admitting: Gastroenterology

## 2016-05-01 ENCOUNTER — Ambulatory Visit: Payer: Self-pay | Admitting: Anesthesiology

## 2016-05-01 DIAGNOSIS — F17210 Nicotine dependence, cigarettes, uncomplicated: Secondary | ICD-10-CM | POA: Insufficient documentation

## 2016-05-01 DIAGNOSIS — Z79899 Other long term (current) drug therapy: Secondary | ICD-10-CM | POA: Insufficient documentation

## 2016-05-01 DIAGNOSIS — R198 Other specified symptoms and signs involving the digestive system and abdomen: Secondary | ICD-10-CM

## 2016-05-01 DIAGNOSIS — R933 Abnormal findings on diagnostic imaging of other parts of digestive tract: Secondary | ICD-10-CM | POA: Insufficient documentation

## 2016-05-01 DIAGNOSIS — K219 Gastro-esophageal reflux disease without esophagitis: Secondary | ICD-10-CM | POA: Insufficient documentation

## 2016-05-01 DIAGNOSIS — K641 Second degree hemorrhoids: Secondary | ICD-10-CM | POA: Insufficient documentation

## 2016-05-01 HISTORY — PX: COLONOSCOPY WITH PROPOFOL: SHX5780

## 2016-05-01 HISTORY — DX: Gastro-esophageal reflux disease without esophagitis: K21.9

## 2016-05-01 SURGERY — COLONOSCOPY WITH PROPOFOL
Anesthesia: Monitor Anesthesia Care | Wound class: Contaminated

## 2016-05-01 MED ORDER — SIMETHICONE 40 MG/0.6ML PO SUSP
ORAL | Status: DC | PRN
  Administered 2016-05-01: 08:00:00

## 2016-05-01 MED ORDER — LIDOCAINE HCL (CARDIAC) 20 MG/ML IV SOLN
INTRAVENOUS | Status: DC | PRN
  Administered 2016-05-01: 50 mg via INTRAVENOUS

## 2016-05-01 MED ORDER — OXYCODONE HCL 5 MG/5ML PO SOLN: 5.0000 mg | Freq: Once | ORAL | Status: DC | PRN

## 2016-05-01 MED ORDER — OXYCODONE HCL 5 MG PO TABS: 5.0000 mg | ORAL_TABLET | Freq: Once | ORAL | Status: DC | PRN

## 2016-05-01 MED ORDER — LACTATED RINGERS IV SOLN
INTRAVENOUS | Status: DC
  Administered 2016-05-01: 07:00:00 via INTRAVENOUS

## 2016-05-01 MED ORDER — PROPOFOL 10 MG/ML IV BOLUS
INTRAVENOUS | Status: DC | PRN
  Administered 2016-05-01: 50 mg via INTRAVENOUS
  Administered 2016-05-01: 30 mg via INTRAVENOUS
  Administered 2016-05-01 (×2): 50 mg via INTRAVENOUS
  Administered 2016-05-01: 100 mg via INTRAVENOUS

## 2016-05-01 SURGICAL SUPPLY — 23 items

## 2016-05-01 NOTE — Anesthesia Procedure Notes (Signed)
Procedure Name: MAC Performed by: Bertram Haddix Pre-anesthesia Checklist: Patient identified, Emergency Drugs available, Suction available, Timeout performed and Patient being monitored Patient Re-evaluated:Patient Re-evaluated prior to inductionOxygen Delivery Method: Nasal cannula Placement Confirmation: positive ETCO2     

## 2016-05-01 NOTE — Transfer of Care (Signed)
Immediate Anesthesia Transfer of Care Note  Patient: Juan Holder  Procedure(s) Performed: Procedure(s): COLONOSCOPY WITH PROPOFOL (N/A)  Patient Location: PACU  Anesthesia Type: MAC  Level of Consciousness: awake, alert  and patient cooperative  Airway and Oxygen Therapy: Patient Spontanous Breathing and Patient connected to supplemental oxygen  Post-op Assessment: Post-op Vital signs reviewed, Patient's Cardiovascular Status Stable, Respiratory Function Stable, Patent Airway and No signs of Nausea or vomiting  Post-op Vital Signs: Reviewed and stable  Complications: No apparent anesthesia complications

## 2016-05-01 NOTE — Anesthesia Preprocedure Evaluation (Signed)
Anesthesia Evaluation  Patient identified by MRN, date of birth, ID band  Reviewed: NPO status   History of Anesthesia Complications Negative for: history of anesthetic complications  Airway Mallampati: II  TM Distance: >3 FB Neck ROM: full    Dental no notable dental hx.    Pulmonary Current Smoker,    Pulmonary exam normal        Cardiovascular Exercise Tolerance: Good negative cardio ROS Normal cardiovascular exam     Neuro/Psych negative neurological ROS  negative psych ROS   GI/Hepatic Neg liver ROS, GERD  ,  Endo/Other  negative endocrine ROS  Renal/GU negative Renal ROS  negative genitourinary   Musculoskeletal   Abdominal   Peds  Hematology negative hematology ROS (+)   Anesthesia Other Findings   Reproductive/Obstetrics                             Anesthesia Physical Anesthesia Plan  ASA: II  Anesthesia Plan: MAC   Post-op Pain Management:    Induction:   Airway Management Planned:   Additional Equipment:   Intra-op Plan:   Post-operative Plan:   Informed Consent: I have reviewed the patients History and Physical, chart, labs and discussed the procedure including the risks, benefits and alternatives for the proposed anesthesia with the patient or authorized representative who has indicated his/her understanding and acceptance.     Plan Discussed with: CRNA  Anesthesia Plan Comments:         Anesthesia Quick Evaluation

## 2016-05-01 NOTE — Anesthesia Postprocedure Evaluation (Signed)
Anesthesia Post Note  Patient: Juan HalstedMelvin D Marker  Procedure(s) Performed: Procedure(s) (LRB): COLONOSCOPY WITH PROPOFOL (N/A)  Patient location during evaluation: PACU Anesthesia Type: MAC Level of consciousness: awake and alert Pain management: pain level controlled Vital Signs Assessment: post-procedure vital signs reviewed and stable Respiratory status: spontaneous breathing, nonlabored ventilation, respiratory function stable and patient connected to nasal cannula oxygen Cardiovascular status: stable and blood pressure returned to baseline Anesthetic complications: no    Sandara Tyree

## 2016-05-01 NOTE — Op Note (Signed)
[facility]enter[facility] Patient Name: Juan PebblesMelvin Holder Procedure Date: 05/01/2016 7:47 AM MRN: [id number] Account #: [id number] Date of Birth: 01/27/1976 Admit Type: Outpatient Age: [age] [facility]OM 01 Gender: Male Note Status: Finalized Procedure:            Colonoscopy Indications:          Abnormal CT of the GI tract Providers:            Midge Miniumarren Amareon Phung MD, MD Medicines:            Propofol per Anesthesia Complications:        No immediate complications. Procedure:            Pre-Anesthesia Assessment:                       - Prior to the procedure, a History and Physical was                        performed, and patient medications and allergies were                        reviewed. The patient's tolerance of previous                        anesthesia was also reviewed. The risks and benefits of                        the procedure and the sedation options and risks were                        discussed with the patient. All questions were                        answered, and informed consent was obtained. Prior                        Anticoagulants: The patient has taken no previous                        anticoagulant or antiplatelet agents. ASA Grade                        Assessment: II - A patient with mild systemic disease.                        After reviewing the risks and benefits, the patient was                        deemed in satisfactory condition to undergo the                        procedure.                       After obtaining informed consent, the colonoscope was                        passed under direct vision. Throughout the procedure,                        the patient's blood  pressure, pulse, and oxygen                        saturations were monitored continuously. The was                        introduced through the anus and advanced to the the                        terminal ileum. The colonoscopy was performed without                  difficulty. The patient tolerated the procedure well.                        The quality of the bowel preparation was poor. Findings:      The perianal and digital rectal examinations were normal.      The terminal ileum appeared normal. Biopsies were taken with a cold       forceps for histology.      A moderate amount of stool was found in the entire colon.      Non-bleeding internal hemorrhoids were found during retroflexion. The       hemorrhoids were Grade II (internal hemorrhoids that prolapse but reduce       spontaneously). Impression:           - Preparation of the colon was poor.                       - The examined portion of the ileum was normal.                        Biopsied.                       - Stool in the entire examined colon. Recommendation:       - Discharge patient to home.                       - Resume previous diet.                       - Continue present medications. Procedure Code(s):    --- Professional ---                       [id number], Colonoscopy, flexible; with biopsy, single or                        multiple Diagnosis Code(s):    --- Professional ---                       R93.3, Abnormal findings on diagnostic imaging of other                        parts of digestive tract CPT copyright 2016 American Medical Association. All rights reserved. The codes documented in this report are preliminary and upon coder review may  be revised to meet current compliance requirements. Midge Miniumarren Laird Runnion MD, MD 05/01/2016 8:09:44 AM This report has been signed electronically. Number of Addenda: 0 Note Initiated On: 05/01/2016 7:47 AM Scope Withdrawal Time: 0 hours 5 minutes 21 seconds  Total Procedure Duration: 0  hours 9 minutes 4 seconds       [facility]

## 2016-05-01 NOTE — H&P (Signed)
  Midge Miniumarren Jessalynn Mccowan, MD [facility] [address] [place], [place] [address] Phone: [phone] Fax : [phone]  Primary Care Physician:  No PCP Per Patient Primary Gastroenterologist:  Dr. Servando SnareWohl  Pre-Procedure History & Physical: HPI:  Juan Holder is a 40 y.o. male is here for an colonoscopy.   Past Medical History:  Diagnosis Date  . GERD (gastroesophageal reflux disease)     Past Surgical History:  Procedure Laterality Date  . NO PAST SURGERIES      Prior to Admission medications   Medication Sig Start Date End Date Taking? Authorizing Provider  Omega-3 Fatty Acids (FISH OIL PO) Take by mouth.   Yes Historical Provider, MD  omeprazole (PRILOSEC) 20 MG capsule Take 20 mg by mouth daily.   Yes Historical Provider, MD  polyethylene glycol (GOLYTELY) 236 g solution Drink one 8 oz glass every 20 mins until stools are clear. 04/11/16  Yes Midge Miniumarren Remee Charley, MD  triamcinolone (KENALOG) 0.025 % cream Apply 1 application topically daily.   Yes Historical Provider, MD    Allergies as of 04/11/2016  . (No Known Allergies)    Family History  Problem Relation Age of Onset  . Hypertension Father   . Lumbar disc disease Father     Social History   Social History  . Marital status: Significant Other    Spouse name: N/A  . Number of children: N/A  . Years of education: N/A   Occupational History  . Not on file.   Social History Main Topics  . Smoking status: Current Every Day Smoker    Packs/day: 0.50    Years: 26.00    Types: Cigarettes  . Smokeless tobacco: Never Used  . Alcohol use Yes     Comment: 1 Beer Weekly  . Drug use: No  . Sexual activity: Yes   Other Topics Concern  . Not on file   Social History Narrative  . No narrative on file    Review of Systems: See HPI, otherwise negative ROS  Physical Exam: BP (!) 142/82   Pulse (!) 58   Temp 97.1 F (36.2 C) (Temporal)   Resp 16   Ht 6\' 2"  (1.88 m)   Wt 212 lb (96.2 kg)   SpO2 100%   BMI 27.22 kg/m    General:   Alert,  pleasant and cooperative in NAD Head:  Normocephalic and atraumatic. Neck:  Supple; no masses or thyromegaly. Lungs:  Clear throughout to auscultation.    Heart:  Regular rate and rhythm. Abdomen:  Soft, nontender and nondistended. Normal bowel sounds, without guarding, and without rebound.   Neurologic:  Alert and  oriented x4;  grossly normal neurologically.  Impression/Plan: Juan Holder is here for an colonoscopy to be performed for abnormal CT  Risks, benefits, limitations, and alternatives regarding  colonoscopy have been reviewed with the patient.  Questions have been answered.  All parties agreeable.   Midge Miniumarren Camerin Ladouceur, MD  05/01/2016, 7:37 AM

## 2016-05-02 ENCOUNTER — Encounter: Payer: Self-pay | Admitting: Gastroenterology

## 2016-05-03 ENCOUNTER — Encounter: Payer: Self-pay | Admitting: Urology

## 2016-05-03 ENCOUNTER — Ambulatory Visit (INDEPENDENT_AMBULATORY_CARE_PROVIDER_SITE_OTHER): Payer: Self-pay | Admitting: Urology

## 2016-05-03 VITALS — BP 117/70 | HR 62 | Ht 74.0 in | Wt 213.5 lb

## 2016-05-03 DIAGNOSIS — N529 Male erectile dysfunction, unspecified: Secondary | ICD-10-CM

## 2016-05-03 DIAGNOSIS — R1031 Right lower quadrant pain: Secondary | ICD-10-CM

## 2016-05-03 MED ORDER — SILDENAFIL CITRATE 20 MG PO TABS: 20.0000 mg | ORAL_TABLET | ORAL | 11 refills | Status: DC | PRN
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
## 2016-05-11 ENCOUNTER — Encounter: Payer: Self-pay | Admitting: Gastroenterology

## 2016-05-14 ENCOUNTER — Encounter: Payer: Self-pay | Admitting: Gastroenterology

## 2016-10-15 ENCOUNTER — Encounter: Payer: Self-pay | Admitting: Emergency Medicine

## 2016-10-15 ENCOUNTER — Emergency Department
Admission: EM | Admit: 2016-10-15 | Discharge: 2016-10-15 | Disposition: A | Discharge status: Home / Self Care | Payer: Self-pay | Attending: Emergency Medicine | Admitting: Emergency Medicine

## 2016-10-15 DIAGNOSIS — Y939 Activity, unspecified: Secondary | ICD-10-CM | POA: Insufficient documentation

## 2016-10-15 DIAGNOSIS — Y999 Unspecified external cause status: Secondary | ICD-10-CM | POA: Insufficient documentation

## 2016-10-15 DIAGNOSIS — F17210 Nicotine dependence, cigarettes, uncomplicated: Secondary | ICD-10-CM | POA: Insufficient documentation

## 2016-10-15 DIAGNOSIS — S60142A Contusion of left ring finger with damage to nail, initial encounter: Secondary | ICD-10-CM | POA: Insufficient documentation

## 2016-10-15 DIAGNOSIS — Y929 Unspecified place or not applicable: Secondary | ICD-10-CM | POA: Insufficient documentation

## 2016-10-15 DIAGNOSIS — S6010XA Contusion of unspecified finger with damage to nail, initial encounter: Secondary | ICD-10-CM

## 2016-10-15 DIAGNOSIS — W230XXA Caught, crushed, jammed, or pinched between moving objects, initial encounter: Secondary | ICD-10-CM | POA: Insufficient documentation

## 2016-10-15 NOTE — ED Provider Notes (Signed)
[facility] Emergency Department Provider Note  ____________________________________________  Time seen: Approximately 8:49 AM  I have reviewed the triage vital signs and the nursing notes.   HISTORY  Chief Complaint Hand Pain    HPI Juan Holder is a 41 y.o. male that presents to emergency department after jammingleft ring finger in the car door yesterday. Patient denies any pain. No sensation changes. He denies any additional injuries. He is requesting a work note. He denies fever, shortness of breath, chest pain, nausea, vomiting, abdominal pain.   Past Medical History:  Diagnosis Date  . GERD (gastroesophageal reflux disease)     Patient Active Problem List   Diagnosis Date Noted  . Abnormal findings-gastrointestinal tract     Past Surgical History:  Procedure Laterality Date  . COLONOSCOPY WITH PROPOFOL N/A 05/01/2016   Procedure: COLONOSCOPY WITH PROPOFOL;  Surgeon: Midge Minium, MD;  Location: [facility];  Service: Endoscopy;  Laterality: N/A;  . NO PAST SURGERIES      Prior to Admission medications   Medication Sig Start Date End Date Taking? Authorizing Provider  Omega-3 Fatty Acids (FISH OIL PO) Take by mouth.    Historical Provider, MD  omeprazole (PRILOSEC) 20 MG capsule Take 20 mg by mouth daily.    Historical Provider, MD  polyethylene glycol (GOLYTELY) 236 g solution Drink one 8 oz glass every 20 mins until stools are clear. Patient not taking: Reported on 05/03/2016 04/11/16   Midge Minium, MD  sildenafil (REVATIO) 20 MG tablet Take 1 tablet (20 mg total) by mouth as needed. Take 1-5 tabs as needed prior to intercourse 05/03/16   Vanna Scotland, MD  triamcinolone (KENALOG) 0.025 % cream Apply 1 application topically daily.    Historical Provider, MD    Allergies Patient has no known allergies.  Family History  Problem Relation Age of Onset  . Hypertension Father   . Lumbar disc disease Father   . Kidney cancer Maternal  Aunt   . Prostate cancer Paternal Uncle   . Bladder Cancer Neg Hx     Social History Social History  Substance Use Topics  . Smoking status: Current Every Day Smoker    Packs/day: 0.50    Years: 26.00    Types: Cigarettes  . Smokeless tobacco: Never Used  . Alcohol use Yes     Comment: 1 Beer Weekly     Review of Systems  Constitutional: No fever/chills Cardiovascular: No chest pain. Respiratory: No SOB. Gastrointestinal: No abdominal pain.  No nausea, no vomiting.  Musculoskeletal: Negative for finger pain. Skin: Negative for rash, abrasions, lacerations. Neurological: Negative for headaches, numbness or tingling   ____________________________________________   PHYSICAL EXAM:  VITAL SIGNS: ED Triage Vitals  Enc Vitals Group     BP 10/15/16 0840 118/67     Holder Rate 10/15/16 0840 76     Resp 10/15/16 0840 18     Temp 10/15/16 0840 98.3 F (36.8 C)     Temp Source 10/15/16 0840 Oral     SpO2 10/15/16 0840 97 %     Weight 10/15/16 0840 197 lb (89.4 kg)     Height 10/15/16 0840  (1.88 m)     Head Circumference --      Peak Flow --      Pain Score 10/15/16 0839 5     Pain Loc --      Pain Edu? --      Excl. in GC? --      Constitutional:  Alert and oriented. Well appearing and in no acute distress. Eyes: Conjunctivae are normal. PERRL. EOMI. Head: Atraumatic. ENT:      Ears:      Nose: No congestion/rhinnorhea.      Mouth/Throat: Mucous membranes are moist.  Neck: No stridor.   Cardiovascular: Normal rate, regular rhythm.  Good peripheral circulation. 2+ radial pulses. Respiratory: Normal respiratory effort without tachypnea or retractions. Lungs CTAB. Good air entry to the bases with no decreased or absent breath sounds. Musculoskeletal: Full range of motion to all extremities. No gross deformities appreciated. Neurologic:  Normal speech and language. No gross focal neurologic deficits are appreciated.  Skin:  Skin is warm, dry and intact. Blood  under left ring finger nail. Finger nontender to palpation.   ____________________________________________   LABS (all labs ordered are listed, but only abnormal results are displayed)  Labs Reviewed - No data to display ____________________________________________  EKG   ____________________________________________  RADIOLOGY No results found.  ____________________________________________    PROCEDURES  Procedure(s) performed:    Procedures  Trephination was performed in ED with 18-gauge needle and blood was drained.  Medications - No data to display   ____________________________________________   INITIAL IMPRESSION / ASSESSMENT AND PLAN / ED COURSE  Pertinent labs & imaging results that were available during my care of the patient were reviewed by me and considered in my medical decision making (see chart for details).  Review of the [place] CSRS was performed in accordance of the [facility] prior to dispensing any controlled drugs.     Patient's diagnosis is consistent with subungual hematoma. Vital signs and exam are reassuring. Trephination was performed and blood was drained. Patient denies any pain. Work note was provided. Patient is to follow up with PCP as directed. Patient is given ED precautions to return to the ED for any worsening or new symptoms.     ____________________________________________  FINAL CLINICAL IMPRESSION(S) / ED DIAGNOSES  Final diagnoses:  None      NEW MEDICATIONS STARTED DURING THIS VISIT:  New Prescriptions   No medications on file        This chart was dictated using voice recognition software/Dragon. Despite best efforts to proofread, errors can occur which can change the meaning. Any change was purely unintentional.    Enid Derry, PA-C 10/15/16 1610    Arnaldo Natal, MD 10/15/16 [phone]

## 2016-10-15 NOTE — ED Triage Notes (Signed)
States he shut his left middle finger in car door yesterday  Swelling noted to tip of finger with some bruising under nail

## 2016-10-15 NOTE — ED Notes (Signed)
FIRST NURSE NOTE: Left middle finger pain, jammed it at work. States he needs a work note.

## 2016-12-17 ENCOUNTER — Emergency Department: Payer: Self-pay

## 2016-12-17 ENCOUNTER — Emergency Department
Admission: EM | Admit: 2016-12-17 | Discharge: 2016-12-18 | Disposition: A | Discharge status: Home / Self Care | Payer: Self-pay | Attending: Emergency Medicine | Admitting: Emergency Medicine

## 2016-12-17 DIAGNOSIS — S52252A Displaced comminuted fracture of shaft of ulna, left arm, initial encounter for closed fracture: Secondary | ICD-10-CM | POA: Insufficient documentation

## 2016-12-17 DIAGNOSIS — W3400XA Accidental discharge from unspecified firearms or gun, initial encounter: Secondary | ICD-10-CM

## 2016-12-17 DIAGNOSIS — S52252B Displaced comminuted fracture of shaft of ulna, left arm, initial encounter for open fracture type I or II: Secondary | ICD-10-CM

## 2016-12-17 DIAGNOSIS — F17210 Nicotine dependence, cigarettes, uncomplicated: Secondary | ICD-10-CM | POA: Insufficient documentation

## 2016-12-17 DIAGNOSIS — Y92099 Unspecified place in other non-institutional residence as the place of occurrence of the external cause: Secondary | ICD-10-CM | POA: Insufficient documentation

## 2016-12-17 DIAGNOSIS — Y998 Other external cause status: Secondary | ICD-10-CM | POA: Insufficient documentation

## 2016-12-17 DIAGNOSIS — Z79899 Other long term (current) drug therapy: Secondary | ICD-10-CM | POA: Insufficient documentation

## 2016-12-17 DIAGNOSIS — Y939 Activity, unspecified: Secondary | ICD-10-CM | POA: Insufficient documentation

## 2016-12-17 LAB — CBC
HCT: 42.7 % (ref 40.0–52.0)
Hemoglobin: 14.5 g/dL (ref 13.0–18.0)
MCH: 31.3 pg (ref 26.0–34.0)
MCHC: 34 g/dL (ref 32.0–36.0)
MCV: 91.8 fL (ref 80.0–100.0)
PLATELETS: 231 10*3/uL (ref 150–440)
RBC: 4.65 MIL/uL (ref 4.40–5.90)
RDW: 13.1 % (ref 11.5–14.5)
WBC: 12.4 10*3/uL — ABNORMAL HIGH (ref 3.8–10.6)

## 2016-12-17 MED ORDER — LIDOCAINE HCL (PF) 1 % IJ SOLN
5.0000 mL | Freq: Once | INTRAMUSCULAR | Status: AC
  Administered 2016-12-17: 5 mL via INTRADERMAL
  Filled 2016-12-17: qty 5

## 2016-12-17 MED ORDER — TETANUS-DIPHTH-ACELL PERTUSSIS 5-2.5-18.5 LF-MCG/0.5 IM SUSP
0.5000 mL | Freq: Once | INTRAMUSCULAR | Status: AC
  Administered 2016-12-17: 0.5 mL via INTRAMUSCULAR
  Filled 2016-12-17: qty 0.5
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
## 2016-12-18 MED ORDER — CEFAZOLIN SODIUM-DEXTROSE 2-4 GM/100ML-% IV SOLN
2.0000 g | Freq: Once | INTRAVENOUS | Status: AC
  Administered 2016-12-18: 2 g via INTRAVENOUS
  Filled 2016-12-18: qty 100

## 2016-12-18 MED ORDER — CEPHALEXIN 500 MG PO CAPS: 500.0000 mg | ORAL_CAPSULE | Freq: Three times a day (TID) | ORAL | 0 refills | Status: AC

## 2016-12-18 MED ORDER — MORPHINE SULFATE (PF) 4 MG/ML IV SOLN
4.0000 mg | Freq: Once | INTRAVENOUS | Status: AC
  Administered 2016-12-18: 4 mg via INTRAVENOUS
  Filled 2016-12-18: qty 1

## 2016-12-18 MED ORDER — OXYCODONE-ACETAMINOPHEN 5-325 MG PO TABS: 1.0000 | ORAL_TABLET | Freq: Four times a day (QID) | ORAL | 0 refills | Status: AC | PRN

## 2016-12-18 MED ORDER — ONDANSETRON HCL 4 MG/2ML IJ SOLN
4.0000 mg | Freq: Once | INTRAMUSCULAR | Status: AC
  Administered 2016-12-18: 4 mg via INTRAVENOUS
  Filled 2016-12-18: qty 2

## 2016-12-18 NOTE — Discharge Instructions (Signed)
Take antibiotics as prescribed to prevent infection of the wound. Keep the splint clean and dry. Call orthopedics Monday morning to arrange clinic follow-up.
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
## 2017-01-22 ENCOUNTER — Other Ambulatory Visit: Payer: Self-pay | Admitting: Urology

## 2017-01-22 DIAGNOSIS — N529 Male erectile dysfunction, unspecified: Secondary | ICD-10-CM

## 2020-10-07 ENCOUNTER — Other Ambulatory Visit: Payer: Self-pay

## 2020-10-07 ENCOUNTER — Ambulatory Visit
Admission: RE | Admit: 2020-10-07 | Discharge: 2020-10-07 | Disposition: A | Discharge status: Home / Self Care | Payer: Self-pay | Source: Ambulatory Visit | Attending: Pediatrics | Admitting: Pediatrics

## 2020-10-07 ENCOUNTER — Ambulatory Visit
Admission: RE | Admit: 2020-10-07 | Discharge: 2020-10-07 | Disposition: A | Discharge status: Home / Self Care | Payer: Self-pay | Source: Ambulatory Visit | Attending: Chiropractic Medicine | Admitting: Chiropractic Medicine

## 2020-10-07 ENCOUNTER — Other Ambulatory Visit: Payer: Self-pay | Admitting: Chiropractic Medicine

## 2020-10-07 DIAGNOSIS — M79641 Pain in right hand: Secondary | ICD-10-CM | POA: Insufficient documentation

## 2020-10-19 ENCOUNTER — Other Ambulatory Visit: Payer: Self-pay | Admitting: Pediatrics

## 2020-10-19 DIAGNOSIS — M79641 Pain in right hand: Secondary | ICD-10-CM

## 2022-10-05 IMAGING — CR DG HAND COMPLETE 3+V*R*
3 series · 3 of 3 positions shown · non-contrast
Comparison: None.

CLINICAL DATA: Acute right hand pain without known injury.

EXAM:
RIGHT HAND - COMPLETE 3+ VIEW

[hand ap]
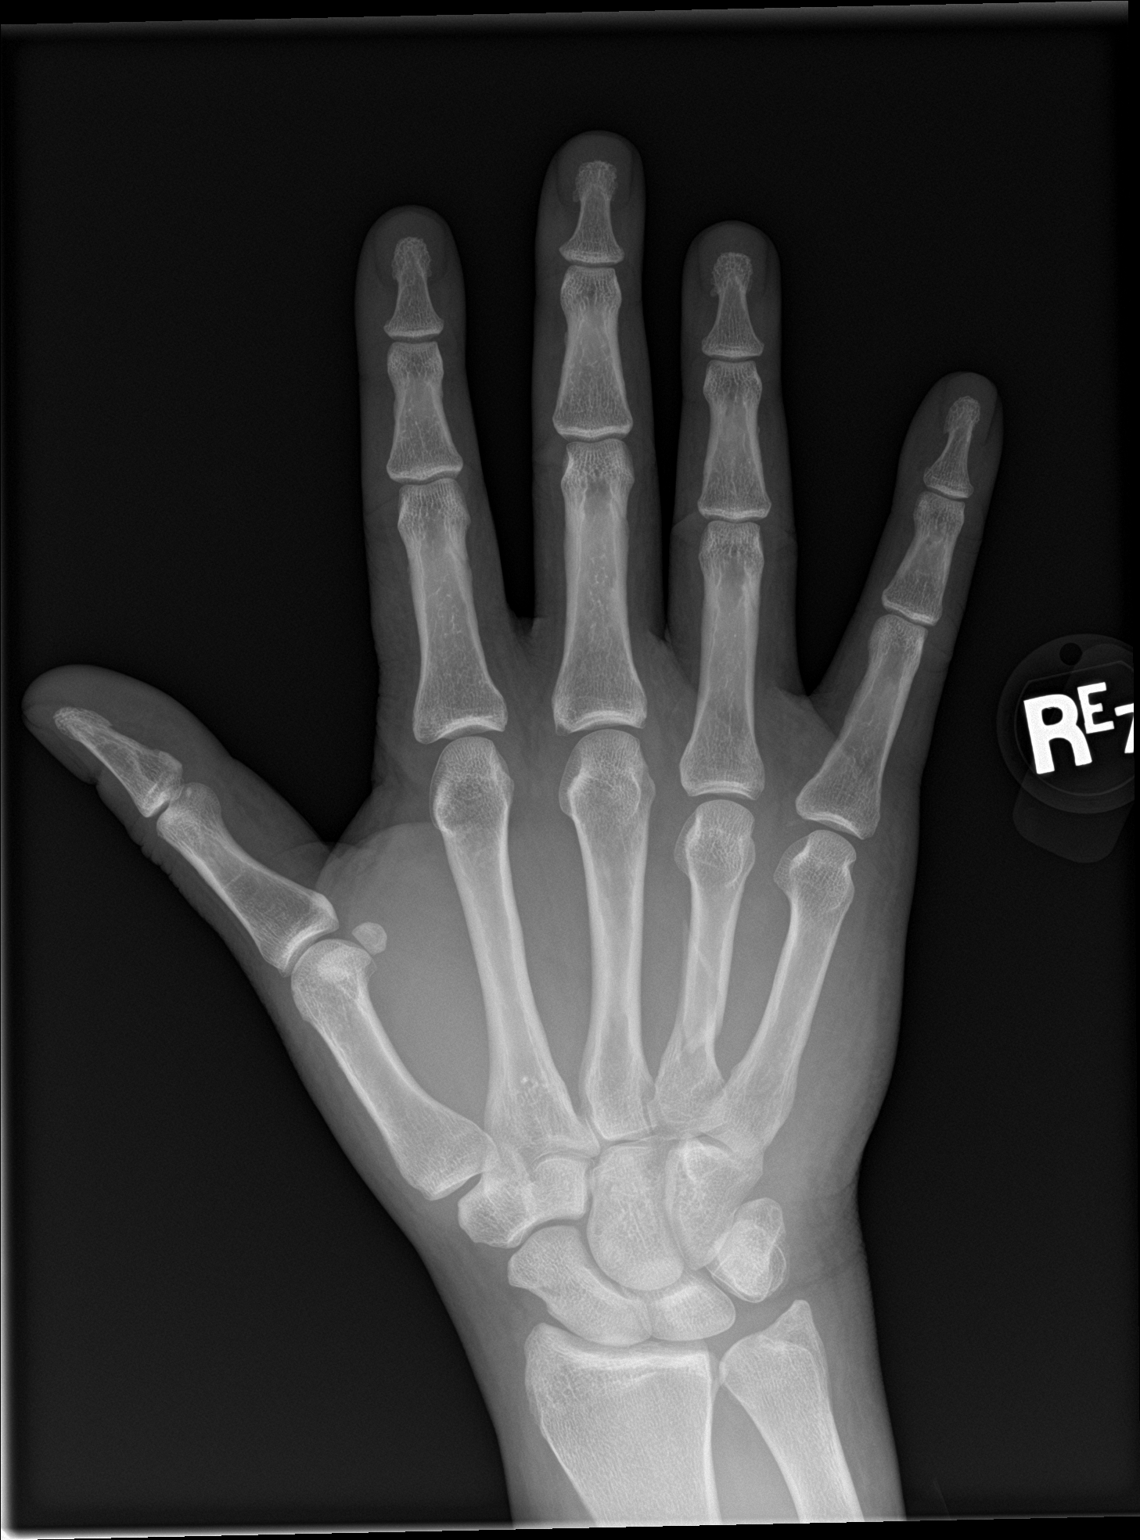

[hand obl]
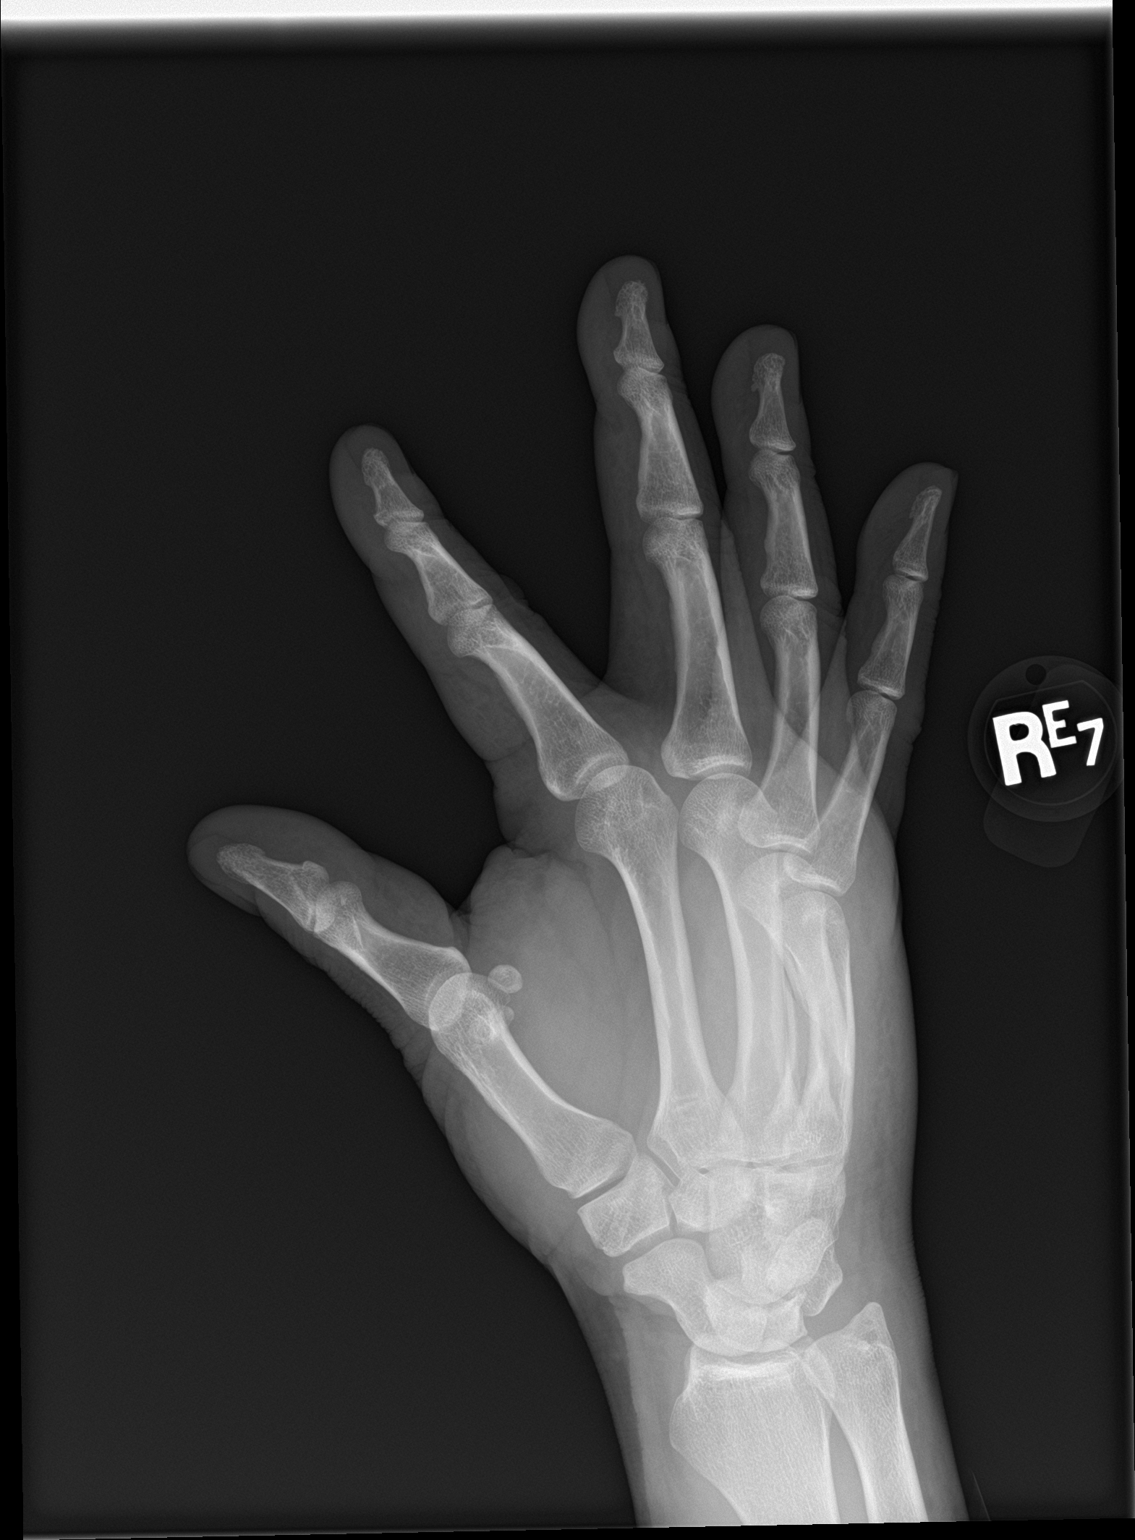

[hand lat]
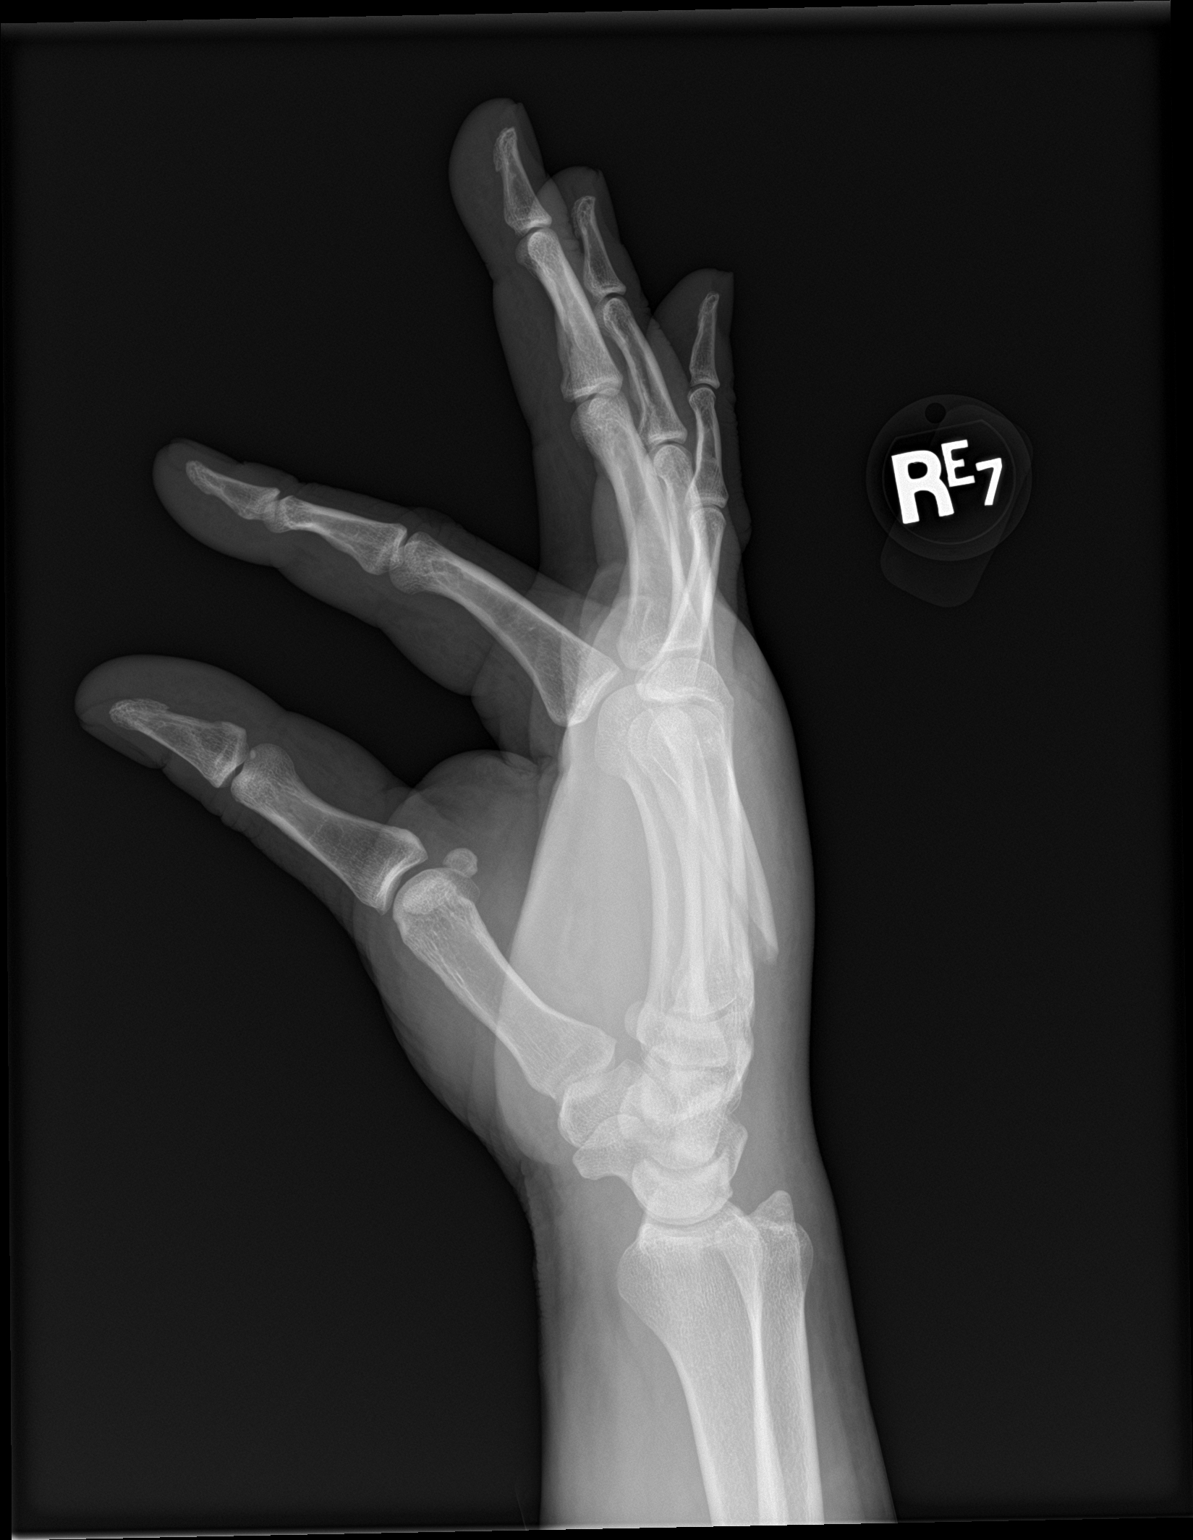

[3 of 3 positions shown; findings below may reference images not displayed]

FINDINGS: Mildly displaced oblique fracture is seen involving the fourth
metacarpal. Joint spaces are intact. No soft tissue abnormality is
noted.
IMPRESSION: Mildly displaced fourth metacarpal fracture.

## 2022-12-07 ENCOUNTER — Ambulatory Visit: Payer: Self-pay

## 2023-08-10 ENCOUNTER — Other Ambulatory Visit: Payer: Self-pay

## 2023-08-10 ENCOUNTER — Ambulatory Visit (LOCAL_COMMUNITY_HEALTH_CENTER): Payer: Self-pay

## 2023-08-10 DIAGNOSIS — Z111 Encounter for screening for respiratory tuberculosis: Secondary | ICD-10-CM

## 2023-08-13 ENCOUNTER — Ambulatory Visit: Payer: Self-pay

## 2023-08-13 DIAGNOSIS — Z111 Encounter for screening for respiratory tuberculosis: Secondary | ICD-10-CM

## 2023-08-13 LAB — TB SKIN TEST
Induration: 0 mm
TB Skin Test: NEGATIVE

## 2023-11-26 ENCOUNTER — Ambulatory Visit (LOCAL_COMMUNITY_HEALTH_CENTER): Payer: Self-pay

## 2023-11-26 DIAGNOSIS — Z111 Encounter for screening for respiratory tuberculosis: Secondary | ICD-10-CM

## 2023-11-29 ENCOUNTER — Ambulatory Visit (LOCAL_COMMUNITY_HEALTH_CENTER): Payer: Self-pay

## 2023-11-29 DIAGNOSIS — Z111 Encounter for screening for respiratory tuberculosis: Secondary | ICD-10-CM

## 2023-11-29 LAB — TB SKIN TEST
Induration: 0 mm
TB Skin Test: NEGATIVE
